# Patient Record
Sex: Female | Born: 1935 | Hispanic: No | Marital: Single
Health system: Southern US, Community
[De-identification: ages and names within clinical notes are randomized; demographics above are authoritative.]

---

## 2005-11-10 ENCOUNTER — Ambulatory Visit: Payer: Self-pay | Admitting: Internal Medicine

## 2005-11-10 ENCOUNTER — Inpatient Hospital Stay (HOSPITAL_COMMUNITY): Admission: AC | Admit: 2005-11-10 | Discharge: 2005-12-15 | Payer: Self-pay

## 2005-11-18 ENCOUNTER — Ambulatory Visit: Payer: Self-pay | Admitting: Physical Medicine & Rehabilitation
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
## 2014-11-01 NOTE — Patient Outreach (Signed)
[facility]) Care Management  11/01/2014  Terry Pena [birth year] [id number]   Referral from [employer], assigned [name], RN to outreach for [facility] Care Management services.  Thanks, Corrie Mckusick. Sharlee Blew [facility] Care Management [facility] CM Assistant Phone: [phone] Fax: [phone]

## 2014-11-03 ENCOUNTER — Other Ambulatory Visit: Payer: Self-pay | Admitting: *Deleted

## 2014-11-03 ENCOUNTER — Encounter: Payer: Self-pay | Admitting: *Deleted

## 2014-11-03 NOTE — Patient Outreach (Signed)
[facility]) Care Management  11/03/2014  Terry Pena [birth year] [id number]  RN Health Coach telephone to patient. This patient is in a facility . Spoke with [name]  Daughter. Services declined because patient is in a facility.  [name] BSN RN [facility] Care Management [phone]

## 2014-11-06 NOTE — Patient Outreach (Signed)
[facility]) Care Management  11/06/2014  Terry Pena 1914/02/14 [id number]   Notification from [name], RN to close case due to patient resides in facility.  Thanks, Corrie Mckusick. Sharlee Blew [facility] Care Management [facility] CM Assistant Phone: [phone] Fax: [phone]

## 2015-01-28 DEATH — deceased
